# Patient Record
Sex: Female | Born: 1995 | Race: White | Hispanic: No | Marital: Single | Smoking: Never smoker
Health system: Southern US, Community
[De-identification: ages and names within clinical notes are randomized; demographics above are authoritative.]

## PROBLEM LIST (undated history)

## (undated) ENCOUNTER — Inpatient Hospital Stay (HOSPITAL_COMMUNITY): Payer: Self-pay

## (undated) DIAGNOSIS — F32A Depression, unspecified: Secondary | ICD-10-CM

## (undated) DIAGNOSIS — F419 Anxiety disorder, unspecified: Secondary | ICD-10-CM

## (undated) DIAGNOSIS — F329 Major depressive disorder, single episode, unspecified: Secondary | ICD-10-CM

## (undated) HISTORY — PX: APPENDECTOMY: SHX54

---
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
## 2016-10-27 ENCOUNTER — Encounter (HOSPITAL_COMMUNITY): Payer: Self-pay

## 2016-10-27 ENCOUNTER — Emergency Department (HOSPITAL_COMMUNITY)
Admission: EM | Admit: 2016-10-27 | Discharge: 2016-10-27 | Disposition: A | Discharge status: Home / Self Care | Payer: Medicaid Other | Attending: Emergency Medicine | Admitting: Emergency Medicine

## 2016-10-27 DIAGNOSIS — Z0389 Encounter for observation for other suspected diseases and conditions ruled out: Secondary | ICD-10-CM | POA: Diagnosis present

## 2016-10-27 DIAGNOSIS — Z0000 Encounter for general adult medical examination without abnormal findings: Secondary | ICD-10-CM | POA: Insufficient documentation

## 2016-10-27 NOTE — ED Provider Notes (Signed)
[facility]-EMERGENCY DEPT Provider Note   CSN: [id number] Arrival date & time: 10/27/16  0123     History   Chief Complaint Chief Complaint  Patient presents with  . Foreign Body in Ear    HPI Brittany [name] is a 21 y.o. female.  Patient presents with complaint of foreign body sensation in the left ear. She felt something go into her ear earlier in the evening and she couldn't get it out. Since arrival she reports she has become asymptomatic.    The history is provided by the patient. No language interpreter was used.  Foreign Body in Ear     Past Medical History:  Diagnosis Date  . Anxiety   . Depression     Patient Active Problem List   Diagnosis Date Noted  . Nexplanon in place 09/01/2015  . Chronic post-traumatic stress disorder (PTSD) 02/26/2015  . GAD (generalized anxiety disorder) 07/01/2014  . Cannabis use disorder, moderate, dependence (HCC) 07/01/2014  . MDD (major depressive disorder), recurrent severe, without psychosis (HCC) 06/30/2014    Past Surgical History:  Procedure Laterality Date  . APPENDECTOMY      OB History    Gravida Para Term Preterm AB Living   1             SAB TAB Ectopic Multiple Live Births                   Home Medications    Prior to Admission medications   Medication Sig Start Date End Date Taking? Authorizing Provider  clonazePAM (KLONOPIN) 0.5 MG tablet Take 0.5 mg by mouth 2 (two) times daily as needed for anxiety.    Historical Provider, MD  DULoxetine (CYMBALTA) 60 MG capsule Take 60 mg by mouth daily.    Historical Provider, MD  naltrexone (DEPADE) 50 MG tablet Take 50 mg by mouth daily.    Historical Provider, MD  ondansetron (ZOFRAN) 4 MG tablet Take 1 tablet (4 mg total) by mouth every 8 (eight) hours as needed for nausea or vomiting. 12/10/15   Charm Rings, MD  traZODone (DESYREL) 100 MG tablet Take 100 mg by mouth at bedtime.    Historical Provider, MD    Family History History reviewed. No pertinent  family history.  Social History Social History  Substance Use Topics  . Smoking status: Never Smoker  . Smokeless tobacco: Never Used  . Alcohol use No     Allergies   Amoxicillin and Penicillins   Review of Systems Review of Systems  Constitutional: Negative for chills.  HENT:       See HPI.  Gastrointestinal: Negative for nausea.  Neurological: Negative for dizziness.     Physical Exam Updated Vital Signs BP (!) 97/48   Pulse 67   Temp 99.3 F (37.4 C) (Oral)   Resp 18   Ht 5\' 3"  (1.6 m)   Wt 61.2 kg   SpO2 98%   BMI 23.91 kg/m   Physical Exam  Constitutional: She is oriented to person, place, and time. She appears well-developed and well-nourished.  HENT:  Left Ear: No foreign bodies.  Neck: Normal range of motion.  Pulmonary/Chest: Effort normal.  Neurological: She is alert and oriented to person, place, and time.  Skin: Skin is warm and dry.     ED Treatments / Results  Labs (all labs ordered are listed, but only abnormal results are displayed) Labs Reviewed - No data to display  EKG  EKG Interpretation None  Radiology No results found.  Procedures Procedures (including critical care time)  Medications Ordered in ED Medications - No data to display   Initial Impression / Assessment and Plan / ED Course  I have reviewed the triage vital signs and the nursing notes.  Pertinent labs & imaging results that were available during my care of the patient were reviewed by me and considered in my medical decision making (see chart for details).     The patient felt a foreign body in left ear earlier. Nothing seen on exam.   Final Clinical Impressions(s) / ED Diagnoses   Final diagnoses:  None   1. Normal exam  New Prescriptions New Prescriptions   No medications on file     Danne HarborShari Lajuan Godbee, PA-C 10/27/16 [id number]    Dione Boozeavid Glick, MD 10/27/16 [phone]

## 2016-10-27 NOTE — ED Triage Notes (Signed)
Pt sts was leaning against wall and felt something in ear. Now subsided.

## 2017-11-07 IMAGING — US US OB TRANSVAGINAL
1 series · 13 of 28 positions shown · non-contrast
Comparison: None.

CLINICAL DATA: Vaginal bleeding

EXAM:
OBSTETRIC <14 WK US AND TRANSVAGINAL OB US
TECHNIQUE: Both transabdominal and transvaginal ultrasound examinations were
performed for complete evaluation of the gestation as well as the
maternal uterus, adnexal regions, and pelvic cul-de-sac.
Transvaginal technique was performed to assess early pregnancy.

[Series 1: us ob transvaginal · 0.20mm/px · 82 acquisitions, 13 frames shown]
[im 4/82]
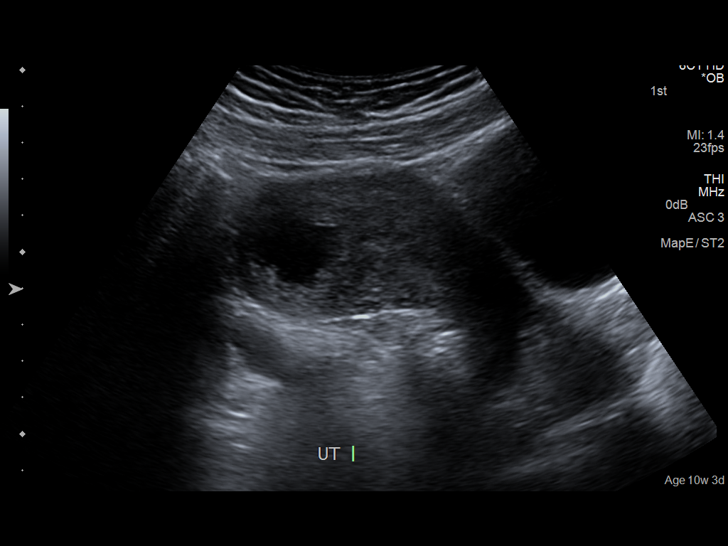
[im 10/82]
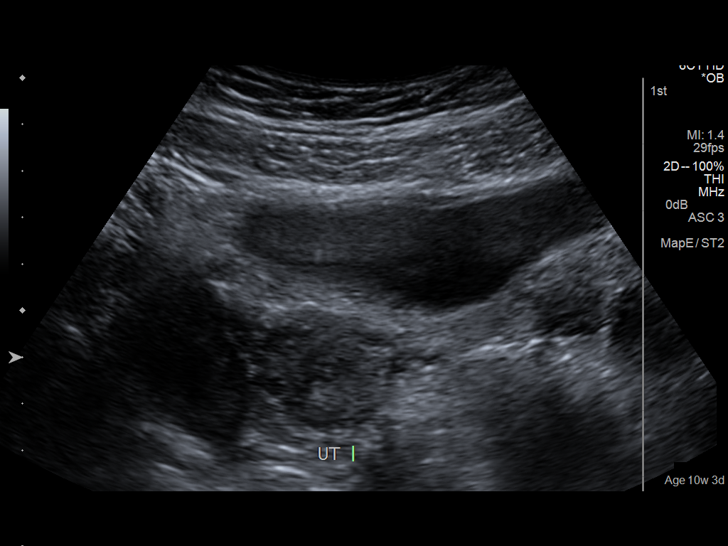
[im 16/82]
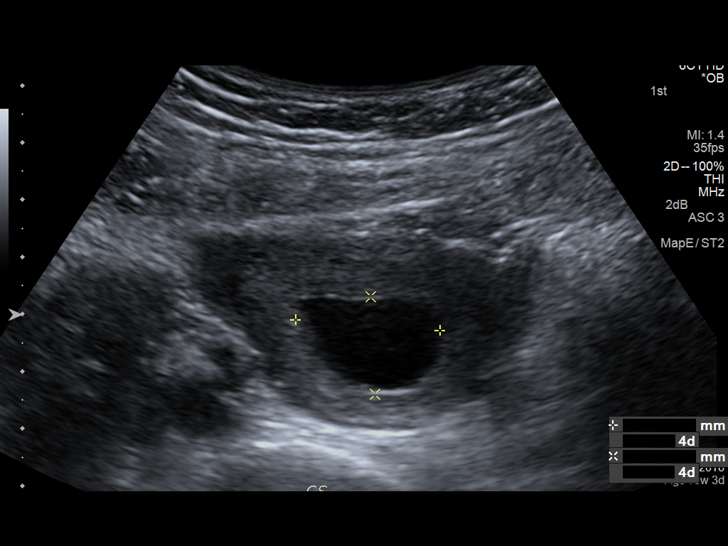
[im 22/82]
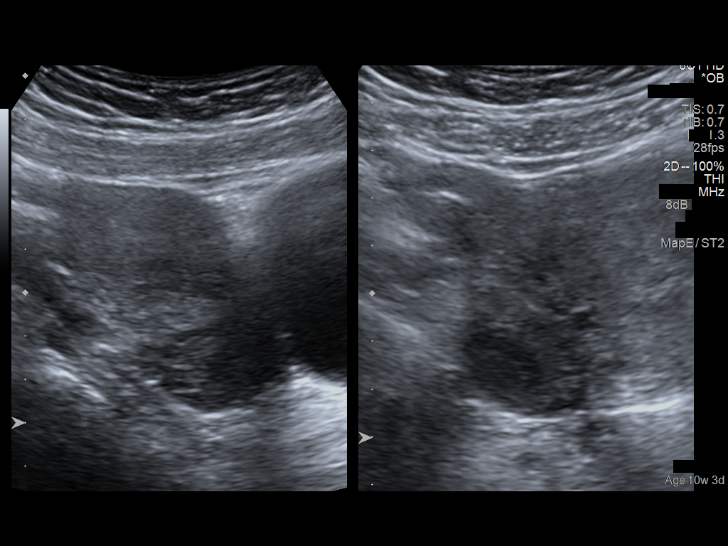
[im 28/82]
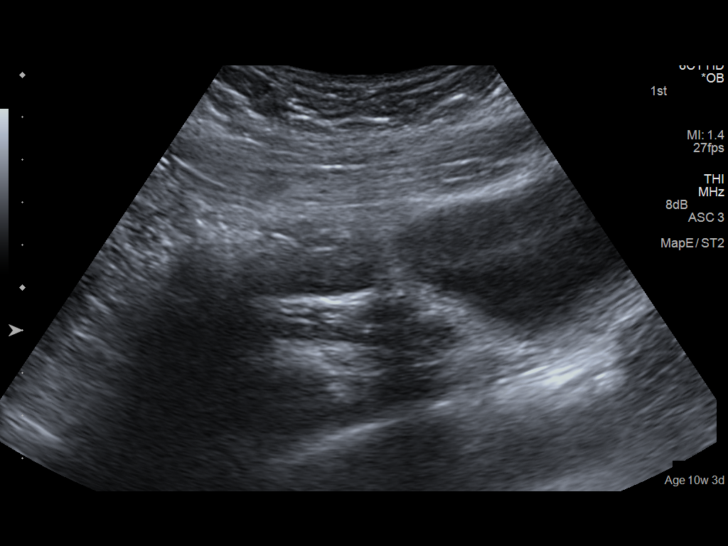
[im 34/82]
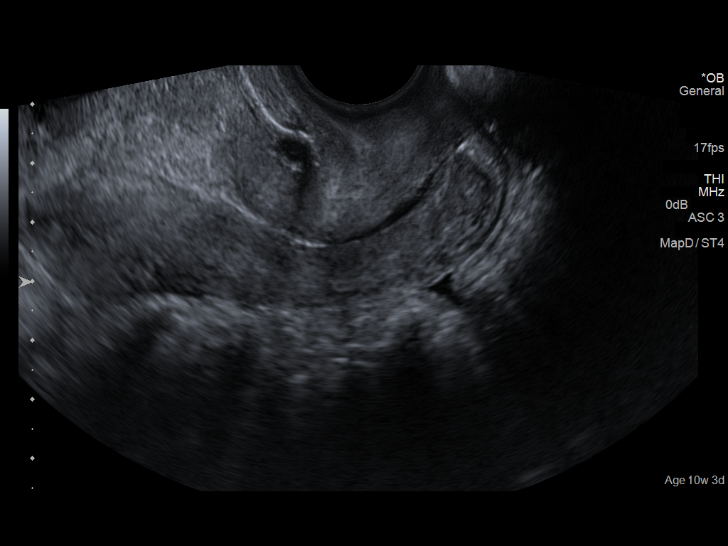
[im 43/82]
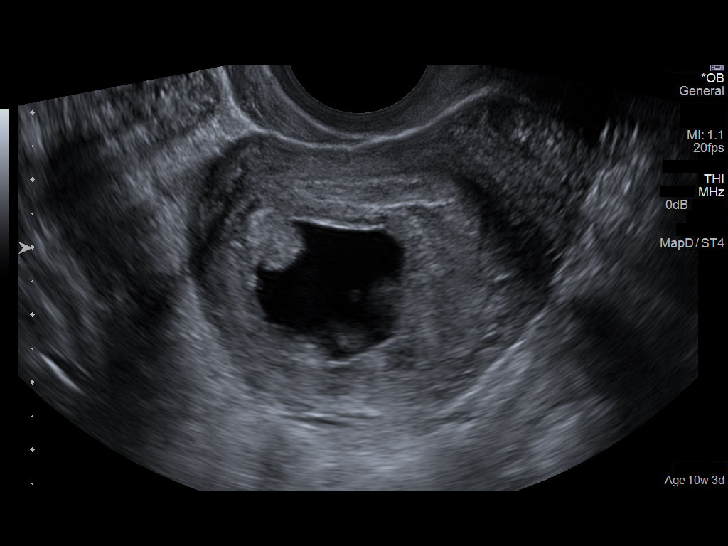
[im 49/82]
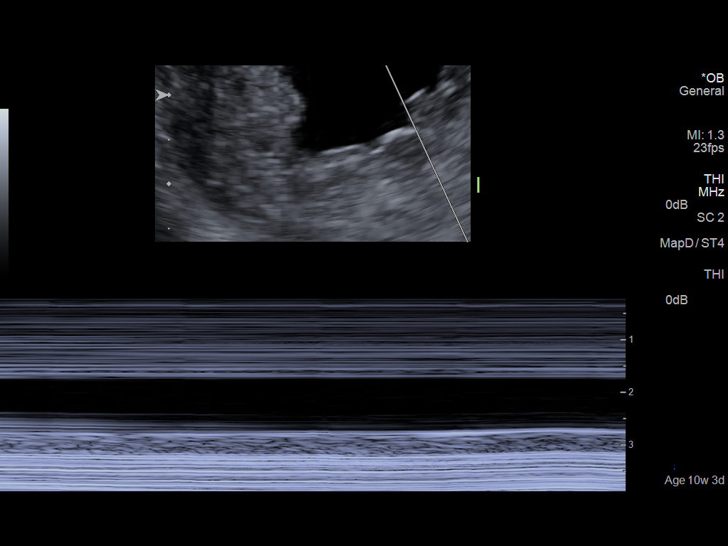
[im 55/82]
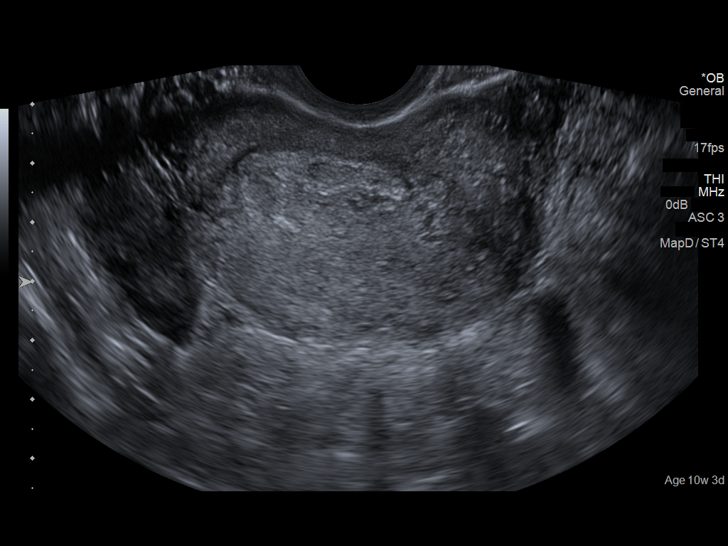
[im 61/82]
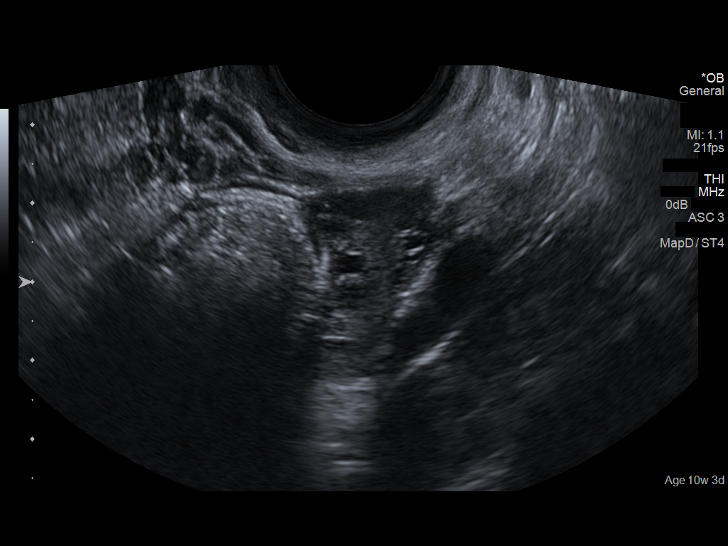
[im 67/82]
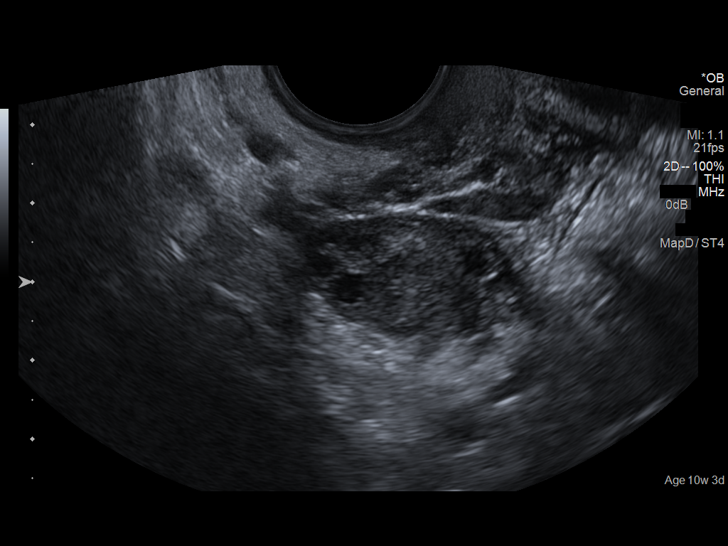
[im 73/82]
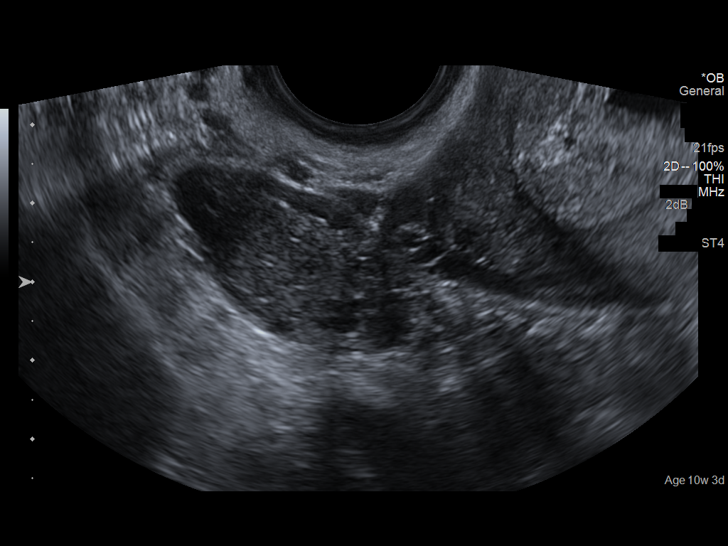
[im 79/82]
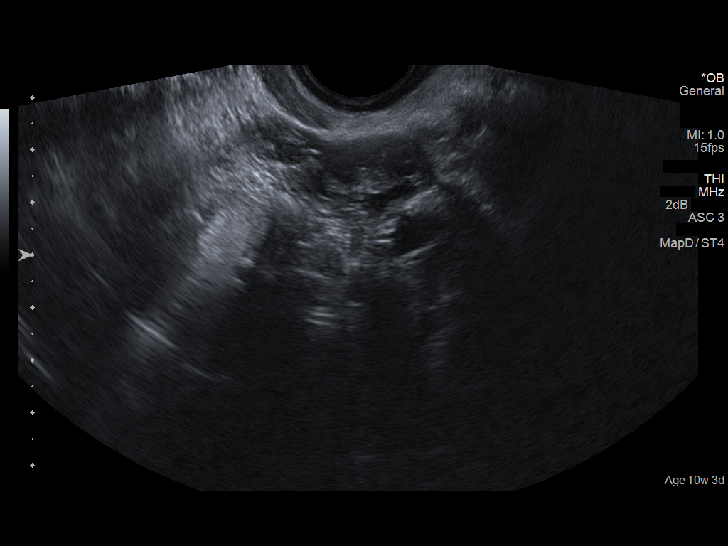

[13 of 28 positions shown; findings below may reference images not displayed]

FINDINGS: Intrauterine gestational sac: Visualized, with irregular contour

Yolk sac:  Visualized

Embryo:  Visualized

Cardiac Activity: Not visualized

CRL:  4  mm   6 w   0 d

Maternal uterus/adnexae: There is no demonstrable subchorionic
hemorrhage. Cervical os is closed. There is no extrauterine pelvic
or adnexal mass. Trace free pelvic fluid is noted in the cul-de-sac.
IMPRESSION: There is an irregular gestational sac. There is a fetal pole within
this intrauterine gestational sac ; fetal heart activity is
currently not visualized. Findings are suspicious but not yet
definitive for failed pregnancy. Recommend follow-up US in 10-14
days for definitive diagnosis. This recommendation follows [employer]
consensus guidelines: Diagnostic Criteria for Nonviable Pregnancy
Early in the First Trimester. N Engl J Med 4438; [DATE].

## 2022-02-24 ENCOUNTER — Encounter: Payer: Medicaid Other | Admitting: Obstetrics & Gynecology
